# Patient Record
Sex: Male | Born: 1990 | Race: Black or African American | Hispanic: No | Marital: Single | Smoking: Current every day smoker
Health system: Southern US, Community
[De-identification: ages and names within clinical notes are randomized; demographics above are authoritative.]

## PROBLEM LIST (undated history)

## (undated) DIAGNOSIS — J45909 Unspecified asthma, uncomplicated: Secondary | ICD-10-CM

## (undated) DIAGNOSIS — T7840XA Allergy, unspecified, initial encounter: Secondary | ICD-10-CM

## (undated) HISTORY — PX: NECK SURGERY: SHX720

## (undated) HISTORY — DX: Unspecified asthma, uncomplicated: J45.909

## (undated) HISTORY — DX: Allergy, unspecified, initial encounter: T78.40XA

## (undated) HISTORY — PX: SPINE SURGERY: SHX786

---
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
## 2008-04-01 IMAGING — RF DG CERVICAL SPINE 1V
1 series · 1 of 1 positions shown · non-contrast
Comparison: none

CLINICAL DATA: Cervical spine fracture with hyperflexion injury and posterior ligamentous disruption.  Posterior fusion is performed from C4 to C6.
 CERVICAL SPINE - 1 VIEW:

[Series 1: run · 1 of 1 slices shown]
[im 1/1]
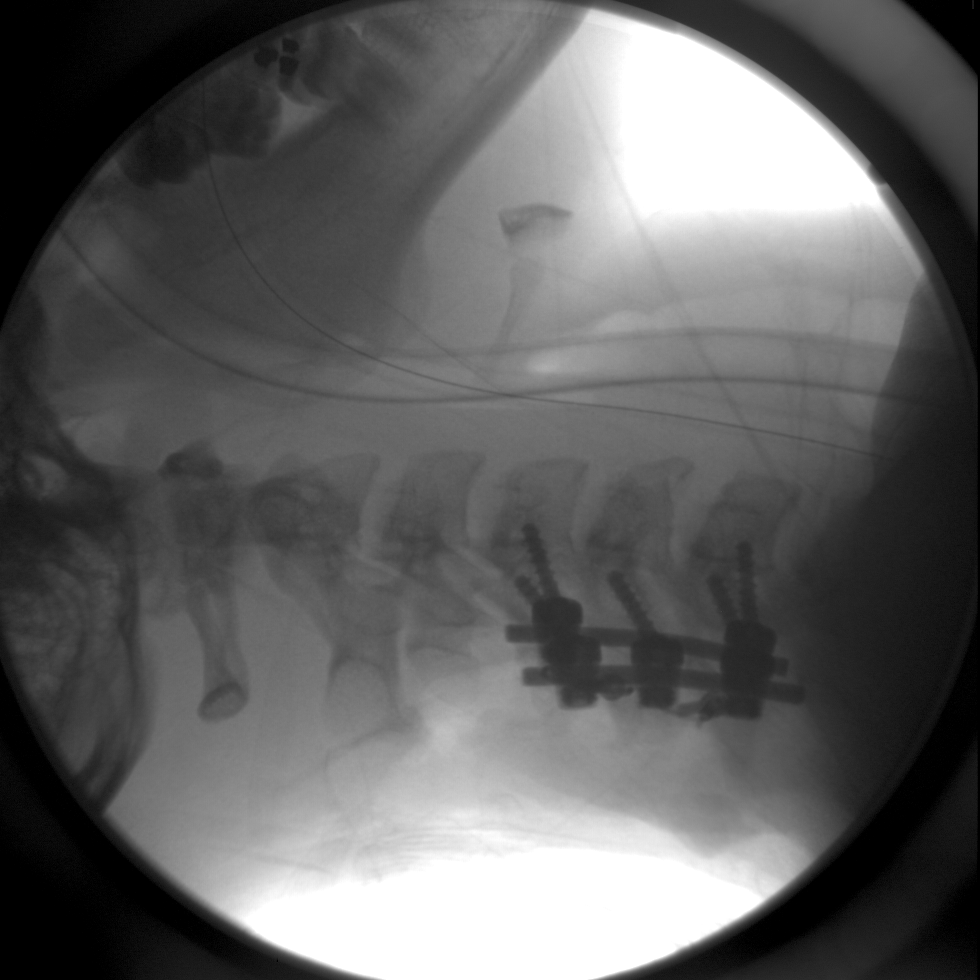

[1 of 1 positions shown; findings below may reference images not displayed]

FINDINGS: Intraoperative spot image done with C-arm demonstrates posterior fusion spanning from C4 to C6 with posterior transpedicular screws and intervening hardware present.  Alignment in the lateral projection appears anatomic.
IMPRESSION: Imaging obtained during posterior fusion spanning from C4 to C6. Alignment appears near anatomic.

## 2012-02-27 ENCOUNTER — Ambulatory Visit: Payer: Self-pay | Admitting: Family Medicine

## 2012-02-27 VITALS — BP 112/67 | HR 88 | Temp 98.5°F | Resp 16 | Ht 69.75 in | Wt 150.6 lb

## 2012-02-27 DIAGNOSIS — L42 Pityriasis rosea: Secondary | ICD-10-CM

## 2012-02-27 NOTE — Progress Notes (Signed)
  Subjective:    Patient ID: Alexander Horn, male    DOB: [birth year], 21 y.o.   MRN: [id number]  HPI 21 yo male here with rash. Noticed on face 2 weeks ago.  Spreading since and on arms and neck aswell.  No pain.  Itch slightly if out in sun but otherwise no.  Never had this before.  Has not tried medicine or creams.  DEnies new soaps, lotions.  No new foods.  Hasn't been out in the woods or nature.  Has seasonal allergies, particularly pollen.    Review of Systems    Negative except as per HPI  Objective:   Physical Exam  Constitutional: He appears well-developed.  Pulmonary/Chest: Effort normal.  Neurological: He is alert.   On trunk, multiple scattered salmon colored, oval , small lesions with some scale in center.  Also present on arms, neck, face.  Also with small, pinpoint lesions.        Assessment & Plan:  Pityriasis rosea - info and reassurance given.

## 2012-02-27 NOTE — Patient Instructions (Signed)
Pityriasis Rosea Pityriasis rosea is a rash which is probably caused by a virus. It generally starts as a scaly, red patch on the trunk (the area of the body that a t-shirt would cover) but does not appear on sun exposed areas. The rash is usually preceded by an initial larger spot called the "herald patch" a week or more before the rest of the rash appears. Generally within one to two days the rash appears rapidly on the trunk, upper arms, and sometimes the upper legs. The rash usually appears as flat, oval patches of scaly pink color. The rash can also be raised and one is able to feel it with a finger. The rash can also be finely crinkled and may slough off leaving a ring of scale around the spot. Sometimes a mild sore throat is present with the rash. It usually affects children and young adults in the spring and autumn. Women are more frequently affected than men. TREATMENT  Pityriasis rosea is a self-limited condition. This means it goes away within 4 to 8 weeks without treatment. The spots may persist for several months, especially in darker-colored skin after the rash has resolved and healed. Benadryl and steroid creams may be used if itching is a problem. SEEK MEDICAL CARE IF:   Your rash does not go away or persists longer than three months.   You develop fever and joint pain.   You develop severe headache and confusion.   You develop breathing difficulty, vomiting and/or extreme weakness.  Document Released: 12/09/2001 Document Revised: 10/22/2011 Document Reviewed: 12/28/2008 [employer] Patient Information 2012 [employer]. 

## 2013-03-04 ENCOUNTER — Ambulatory Visit: Payer: Self-pay | Admitting: Family Medicine

## 2013-03-04 VITALS — BP 125/68 | HR 64 | Temp 98.3°F | Resp 16 | Ht 69.75 in | Wt 153.4 lb

## 2013-03-04 DIAGNOSIS — M545 Low back pain, unspecified: Secondary | ICD-10-CM

## 2013-03-04 DIAGNOSIS — R1012 Left upper quadrant pain: Secondary | ICD-10-CM

## 2013-03-04 DIAGNOSIS — K5900 Constipation, unspecified: Secondary | ICD-10-CM

## 2013-03-04 MED ORDER — OXAPROZIN 600 MG PO TABS: ORAL_TABLET | ORAL | Status: DC

## 2013-03-04 MED ORDER — METHOCARBAMOL 500 MG PO TABS: 500.0000 mg | ORAL_TABLET | Freq: Three times a day (TID) | ORAL | Status: DC

## 2013-03-04 NOTE — Patient Instructions (Addendum)
Take MiraLax one dose daily. If your bowels get to lose decrease to one half dose daily. When you are consistently doing well you can stop it and just use it on as-needed basis.  Take the muscle relaxant and anti-inflammatory medicine as needed. If symptoms continue to persist please return
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
## 2013-10-01 ENCOUNTER — Encounter (HOSPITAL_COMMUNITY): Payer: Self-pay | Admitting: Emergency Medicine

## 2013-10-01 ENCOUNTER — Emergency Department (HOSPITAL_COMMUNITY)
Admission: EM | Admit: 2013-10-01 | Discharge: 2013-10-01 | Disposition: A | Discharge status: Home / Self Care | Payer: No Typology Code available for payment source | Attending: Emergency Medicine | Admitting: Emergency Medicine

## 2013-10-01 DIAGNOSIS — F17200 Nicotine dependence, unspecified, uncomplicated: Secondary | ICD-10-CM | POA: Insufficient documentation

## 2013-10-01 DIAGNOSIS — Z9889 Other specified postprocedural states: Secondary | ICD-10-CM | POA: Insufficient documentation

## 2013-10-01 DIAGNOSIS — S6990XA Unspecified injury of unspecified wrist, hand and finger(s), initial encounter: Secondary | ICD-10-CM | POA: Insufficient documentation

## 2013-10-01 DIAGNOSIS — S139XXA Sprain of joints and ligaments of unspecified parts of neck, initial encounter: Secondary | ICD-10-CM | POA: Insufficient documentation

## 2013-10-01 DIAGNOSIS — S59909A Unspecified injury of unspecified elbow, initial encounter: Secondary | ICD-10-CM | POA: Insufficient documentation

## 2013-10-01 DIAGNOSIS — Y9389 Activity, other specified: Secondary | ICD-10-CM | POA: Insufficient documentation

## 2013-10-01 DIAGNOSIS — J45909 Unspecified asthma, uncomplicated: Secondary | ICD-10-CM | POA: Insufficient documentation

## 2013-10-01 DIAGNOSIS — Y92410 Unspecified street and highway as the place of occurrence of the external cause: Secondary | ICD-10-CM | POA: Insufficient documentation

## 2013-10-01 MED ORDER — DIAZEPAM 5 MG PO TABS: 5.0000 mg | ORAL_TABLET | Freq: Two times a day (BID) | ORAL | Status: DC

## 2013-10-01 MED ORDER — OXYCODONE-ACETAMINOPHEN 5-325 MG PO TABS
2.0000 | ORAL_TABLET | Freq: Once | ORAL | Status: AC
  Administered 2013-10-01: 2 via ORAL
  Filled 2013-10-01: qty 2

## 2013-10-01 MED ORDER — DIAZEPAM 5 MG PO TABS
5.0000 mg | ORAL_TABLET | Freq: Once | ORAL | Status: AC
  Administered 2013-10-01: 5 mg via ORAL
  Filled 2013-10-01: qty 1

## 2013-10-01 MED ORDER — OXYCODONE-ACETAMINOPHEN 5-325 MG PO TABS: 2.0000 | ORAL_TABLET | ORAL | Status: DC | PRN
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
## 2013-10-03 NOTE — ED Provider Notes (Signed)
Medical screening examination/treatment/procedure(s) were performed by non-physician practitioner and as supervising physician I was immediately available for consultation/collaboration.  EKG Interpretation   None         Charles B. Sheldon, MD 10/03/13 0659 

## 2014-11-01 ENCOUNTER — Other Ambulatory Visit: Payer: BC Managed Care – PPO

## 2014-11-01 ENCOUNTER — Ambulatory Visit (INDEPENDENT_AMBULATORY_CARE_PROVIDER_SITE_OTHER): Payer: BC Managed Care – PPO

## 2014-11-01 ENCOUNTER — Ambulatory Visit (INDEPENDENT_AMBULATORY_CARE_PROVIDER_SITE_OTHER): Payer: BC Managed Care – PPO | Admitting: Family Medicine

## 2014-11-01 VITALS — BP 126/74 | HR 68 | Temp 98.1°F | Resp 16 | Ht 68.75 in | Wt 177.4 lb

## 2014-11-01 DIAGNOSIS — R112 Nausea with vomiting, unspecified: Secondary | ICD-10-CM

## 2014-11-01 DIAGNOSIS — R1084 Generalized abdominal pain: Secondary | ICD-10-CM

## 2014-11-01 DIAGNOSIS — R350 Frequency of micturition: Secondary | ICD-10-CM

## 2014-11-01 LAB — POCT CBC
GRANULOCYTE PERCENT: 52.7 % (ref 37–80)
HCT, POC: 49.5 % (ref 43.5–53.7)
HEMOGLOBIN: 16.1 g/dL (ref 14.1–18.1)
LYMPH, POC: 1.9 (ref 0.6–3.4)
MCH, POC: 31.3 pg — AB (ref 27–31.2)
MCHC: 32.6 g/dL (ref 31.8–35.4)
MCV: 96 fL (ref 80–97)
MID (cbc): 0.1 (ref 0–0.9)
MPV: 7.4 fL (ref 0–99.8)
POC GRANULOCYTE: 2.3 (ref 2–6.9)
POC LYMPH %: 44.2 % (ref 10–50)
POC MID %: 3.1 % (ref 0–12)
Platelet Count, POC: 307 10*3/uL (ref 142–424)
RBC: 5.15 M/uL (ref 4.69–6.13)
RDW, POC: 12.7 %
WBC: 7.3 10*3/uL (ref 4.6–10.2)

## 2014-11-01 LAB — POCT URINALYSIS DIPSTICK
BILIRUBIN UA: NEGATIVE
GLUCOSE UA: NEGATIVE
KETONES UA: NEGATIVE
Leukocytes, UA: NEGATIVE
NITRITE UA: NEGATIVE
PH UA: 7
Protein, UA: NEGATIVE
RBC UA: NEGATIVE
SPEC GRAV UA: 1.015
Urobilinogen, UA: 0.2

## 2014-11-01 LAB — POCT UA - MICROSCOPIC ONLY
BACTERIA, U MICROSCOPIC: NEGATIVE
CRYSTALS, UR, HPF, POC: NEGATIVE
Casts, Ur, LPF, POC: NEGATIVE
Epithelial cells, urine per micros: NEGATIVE
MUCUS UA: NEGATIVE
RBC, URINE, MICROSCOPIC: NEGATIVE
WBC, Ur, HPF, POC: NEGATIVE
Yeast, UA: NEGATIVE

## 2014-11-01 LAB — GLUCOSE, POCT (MANUAL RESULT ENTRY): POC GLUCOSE: 71 mg/dL (ref 70–99)

## 2014-11-01 NOTE — Patient Instructions (Addendum)
Cut back on caffeine amounts by half for now. Still stay hydrated with water during the day.   Try over the counter Colace as stool softener.  If no bowel movement in a day - can take over the counter Miralax. You should receive a call or letter about your lab results within the next week to 10 days.  If your abdominal pain does not improve in next 4-5 days - recheck to discuss other causes. Return to the clinic or go to the nearest emergency room if any of your symptoms worsen or new symptoms occur.  Abdominal Pain Many things can cause abdominal pain. Usually, abdominal pain is not caused by a disease and will improve without treatment. It can often be observed and treated at home. Your health care provider will do a physical exam and possibly order blood tests and X-rays to help determine the seriousness of your pain. However, in many cases, more time must pass before a clear cause of the pain can be found. Before that point, your health care provider may not know if you need more testing or further treatment. HOME CARE INSTRUCTIONS  Monitor your abdominal pain for any changes. The following actions may help to alleviate any discomfort you are experiencing:  Only take over-the-counter or prescription medicines as directed by your health care provider.  Do not take laxatives unless directed to do so by your health care provider.  Try a clear liquid diet (broth, tea, or water) as directed by your health care provider. Slowly move to a bland diet as tolerated. SEEK MEDICAL CARE IF:  You have unexplained abdominal pain.  You have abdominal pain associated with nausea or diarrhea.  You have pain when you urinate or have a bowel movement.  You experience abdominal pain that wakes you in the night.  You have abdominal pain that is worsened or improved by eating food.  You have abdominal pain that is worsened with eating fatty foods.  You have a fever. SEEK IMMEDIATE MEDICAL CARE IF:    Your pain does not go away within 2 hours.  You keep throwing up (vomiting).  Your pain is felt only in portions of the abdomen, such as the right side or the left lower portion of the abdomen.  You pass bloody or black tarry stools. MAKE SURE YOU:  Understand these instructions.   Will watch your condition.   Will get help right away if you are not doing well or get worse.  Document Released: 08/12/2005 Document Revised: 11/07/2013 Document Reviewed: 07/12/2013 [facility] LPExitCare Patient Information 2015 [employer], [place]. This information is not intended to replace advice given to you by your health care provider. Make sure you discuss any questions you have with your health care provider.

## 2014-11-01 NOTE — Progress Notes (Addendum)
Subjective:  This chart was scribed for Meredith StaggersJeffrey Concha Sudol, MD by Haywood PaoNadim Abu Hashem, ED Scribe at [facility].The patient was seen in exam room 03 and the patient's care was started at 2:29 PM.   Patient ID: Alexander Horn, male    DOB: 1990-12-10, 23 y.o.   MRN: [id number] Chief Complaint  Patient presents with  . Abdominal Pain    x 2 weeks   HPI HPI Comments: Alexander GrammesDominique Horn is a 23 y.o. male who [facility] of intermittent generalized abdominal pain, onset 2 weeks ago. Lasting about 30 min. Not everyday, comes and goes.  4 days he had nausea and dry heaving, yesterday he had similar complaints. His last normal BM was this morning, these past two weeks he has gone about everyday and they are sometimes runny or hard stools. He has not tried anything for relief. He denies fever, denies dysuria.   Pt also complains about increased urinary frequency, onset 2 months ago. Pt denies weight loss. Pt does have a FHx of diabetes in grandparents.   Hx later in visit:  Drinks 2 [employer] drinks each morning, then soda later in the day. No penile discharge. No recent testicular pain, no hx of STI's, no dysuria. No recent new sexual partners, never been tested for STI's. No new recent new partners.  Greater than 20 lifetime sexual partners.   Works for a [employer].  Smoker - 2-3 cigs per day.  Case beer per week. (up to 2 per day).     There are no active problems to display for this patient.  Past Medical History  Diagnosis Date  . Asthma   . Allergy    Past Surgical History  Procedure Laterality Date  . Spine surgery    . Neck surgery     Allergies  Allergen Reactions  . Pollen Extract    Prior to Admission medications   Medication Sig Start Date End Date Taking? Authorizing Provider  cetirizine (ALL DAY ALLERGY) 10 MG tablet Take 10 mg by mouth daily.   Yes Historical Provider, MD   History   Social History  . Marital Status: Single   Spouse Name: N/A    Number of Children: N/A  . Years of Education: N/A   Occupational History  . Not on file.   Social History Main Topics  . Smoking status: Current Every Day Smoker  . Smokeless tobacco: Not on file  . Alcohol Use: Yes  . Drug Use: No     Comment: former marijuana user  . Sexual Activity: Not on file   Other Topics Concern  . Not on file   Social History Narrative   Review of Systems  Gastrointestinal: Positive for abdominal pain.  Genitourinary: Positive for frequency. Negative for dysuria and difficulty urinating.       Objective:  BP 126/74 mmHg  Pulse 68  Temp(Src) 98.1 F (36.7 C) (Oral)  Resp 16  Ht 5' 8.75" (1.746 m)  Wt 177 lb 6.4 oz (80.468 kg)  BMI 26.40 kg/m2  SpO2 98%  Physical Exam  Constitutional: He is oriented to person, place, and time. He appears well-developed and well-nourished.  HENT:  Head: Normocephalic and atraumatic.  Eyes: EOM are normal. Pupils are equal, round, and reactive to light.  Neck: Normal range of motion. No JVD present. Carotid bruit is not present.  Cardiovascular: Normal rate, regular rhythm and normal heart sounds.   No murmur heard. Pulmonary/Chest: Effort normal and breath  sounds normal. He has no rales.  Abdominal: Soft. Bowel sounds are normal. He exhibits no distension. There is no CVA tenderness.  Musculoskeletal: Normal range of motion. He exhibits no edema.  Neurological: He is alert and oriented to person, place, and time.  Skin: Skin is warm and dry.  Psychiatric: He has a normal mood and affect. His behavior is normal.  Nursing note and vitals reviewed.  Results for orders placed or performed in visit on 11/01/14  POCT urinalysis dipstick  Result Value Ref Range   Color, UA yellow    Clarity, UA clear    Glucose, UA neg    Bilirubin, UA neg    Ketones, UA neg    Spec Grav, UA 1.015    Blood, UA neg    pH, UA 7.0    Protein, UA neg    Urobilinogen, UA 0.2    Nitrite, UA neg     Leukocytes, UA Negative   POCT UA - Microscopic Only  Result Value Ref Range   WBC, Ur, HPF, POC Neg    RBC, urine, microscopic neg    Bacteria, U Microscopic neg    Mucus, UA neg'    Epithelial cells, urine per micros neg    Crystals, Ur, HPF, POC neg    Casts, Ur, LPF, POC neg    Yeast, UA neg   POCT CBC  Result Value Ref Range   WBC 7.3 4.6 - 10.2 K/uL   Lymph, poc 1.9 0.6 - 3.4   POC LYMPH PERCENT 44.2 10 - 50 %L   MID (cbc) 0.1 0 - 0.9   POC MID % 3.1 0 - 12 %M   POC Granulocyte 2.3 2 - 6.9   Granulocyte percent 52.7 37 - 80 %G   RBC 5.15 4.69 - 6.13 M/uL   Hemoglobin 16.1 14.1 - 18.1 g/dL   HCT, POC 65.7 84.6 - 53.7 %   MCV 96.0 80 - 97 fL   MCH, POC 31.3 (A) 27 - 31.2 pg   MCHC 32.6 31.8 - 35.4 g/dL   RDW, POC 96.2 %   Platelet Count, POC 307 142 - 424 K/uL   MPV 7.4 0 - 99.8 fL  POCT glucose (manual entry)  Result Value Ref Range   POC Glucose 71 70 - 99 mg/dl   [facility] reading (PRIMARY) by  Dr. Neva Seat: abd 1 view: increased stool, but otherwise nonspecific bowel gas findings.      Assessment & Plan:  Alexander Horn is a 23 y.o. male Generalized abdominal pain - Plan: POCT glucose (manual entry), DG Abd 1 View, Lipase, Comprehensive metabolic panel, RPR, HIV antibody, GC/Chlamydia Probe Amp, GC/Chlamydia Probe Amp, CANCELED: GC/Chlamydia Probe Amp  -suspected constipation as reassuring exam, blood count. Trial of otc stool softener, miralax if needed. Also discussed caffiene intake modification as below. Check lipase, CMP. rtc precautions and recheck if not improving in next few days.   Nausea and vomiting, vomiting of unspecified type - Plan: POCT CBC, POCT glucose (manual entry), Lipase, Comprehensive metabolic panel, RPR, HIV antibody, GC/Chlamydia Probe Amp, GC/Chlamydia Probe Amp, CANCELED: GC/Chlamydia Probe Amp  - now resolved. Reassuring exam. rtc precautions if persists.   Urinary frequency - Plan: POCT urinalysis dipstick, POCT UA - Microscopic Only,  Lipase, Comprehensive metabolic panel, RPR, HIV antibody, GC/Chlamydia Probe Amp, GC/Chlamydia Probe Amp, CANCELED: GC/Chlamydia Probe Amp  -decrease caffeine, water during day, and if persists- rtc for eval.   No orders of the defined types were placed in this  encounter.   Patient Instructions  Cut back on caffeine amounts by half for now. Still stay hydrated with water during the day.   Try over the counter Colace as stool softener.  If no bowel movement in a day - can take over the counter Miralax. You should receive a call or letter about your lab results within the next week to 10 days.  If your abdominal pain does not improve in next 4-5 days - recheck to discuss other causes. Return to the clinic or go to the nearest emergency room if any of your symptoms worsen or new symptoms occur.  Abdominal Pain Many things can cause abdominal pain. Usually, abdominal pain is not caused by a disease and will improve without treatment. It can often be observed and treated at home. Your health care provider will do a physical exam and possibly order blood tests and X-rays to help determine the seriousness of your pain. However, in many cases, more time must pass before a clear cause of the pain can be found. Before that point, your health care provider may not know if you need more testing or further treatment. HOME CARE INSTRUCTIONS  Monitor your abdominal pain for any changes. The following actions may help to alleviate any discomfort you are experiencing:  Only take over-the-counter or prescription medicines as directed by your health care provider.  Do not take laxatives unless directed to do so by your health care provider.  Try a clear liquid diet (broth, tea, or water) as directed by your health care provider. Slowly move to a bland diet as tolerated. SEEK MEDICAL CARE IF:  You have unexplained abdominal pain.  You have abdominal pain associated with nausea or diarrhea.  You have pain when  you urinate or have a bowel movement.  You experience abdominal pain that wakes you in the night.  You have abdominal pain that is worsened or improved by eating food.  You have abdominal pain that is worsened with eating fatty foods.  You have a fever. SEEK IMMEDIATE MEDICAL CARE IF:   Your pain does not go away within 2 hours.  You keep throwing up (vomiting).  Your pain is felt only in portions of the abdomen, such as the right side or the left lower portion of the abdomen.  You pass bloody or black tarry stools. MAKE SURE YOU:  Understand these instructions.   Will watch your condition.   Will get help right away if you are not doing well or get worse.  Document Released: 08/12/2005 Document Revised: 11/07/2013 Document Reviewed: 07/12/2013 [facility] Patient Information 2015 [employer] [place]. This information is not intended to replace advice given to you by your health care provider. Make sure you discuss any questions you have with your health care provider.     I personally performed the services described in this documentation, which was scribed in my presence. The recorded information has been reviewed and considered, and addended by me as needed.

## 2014-11-02 LAB — HIV ANTIBODY (ROUTINE TESTING W REFLEX): HIV 1&2 Ab, 4th Generation: NONREACTIVE

## 2014-11-02 LAB — COMPREHENSIVE METABOLIC PANEL
ALK PHOS: 47 U/L (ref 39–117)
ALT: 25 U/L (ref 0–53)
AST: 22 U/L (ref 0–37)
Albumin: 4.9 g/dL (ref 3.5–5.2)
BUN: 10 mg/dL (ref 6–23)
CO2: 23 mEq/L (ref 19–32)
CREATININE: 0.89 mg/dL (ref 0.50–1.35)
Calcium: 9.6 mg/dL (ref 8.4–10.5)
Chloride: 102 mEq/L (ref 96–112)
Glucose, Bld: 74 mg/dL (ref 70–99)
Potassium: 4.2 mEq/L (ref 3.5–5.3)
Sodium: 136 mEq/L (ref 135–145)
Total Bilirubin: 0.5 mg/dL (ref 0.2–1.2)
Total Protein: 7.9 g/dL (ref 6.0–8.3)

## 2014-11-02 LAB — RPR

## 2014-11-02 LAB — GC/CHLAMYDIA PROBE AMP
CT PROBE, AMP APTIMA: NEGATIVE
GC PROBE AMP APTIMA: NEGATIVE

## 2014-11-02 LAB — LIPASE: LIPASE: 13 U/L (ref 0–75)

## 2016-05-12 IMAGING — CR DG ABDOMEN 1V
1 series · 1 of 1 positions shown · non-contrast
Comparison: CT abdomen and pelvis September 18, 2006

CLINICAL DATA: Two week history of intermittent abdominal pain,
generalized. Four day history of nausea

EXAM:
ABDOMEN - 1 VIEW

[AP]
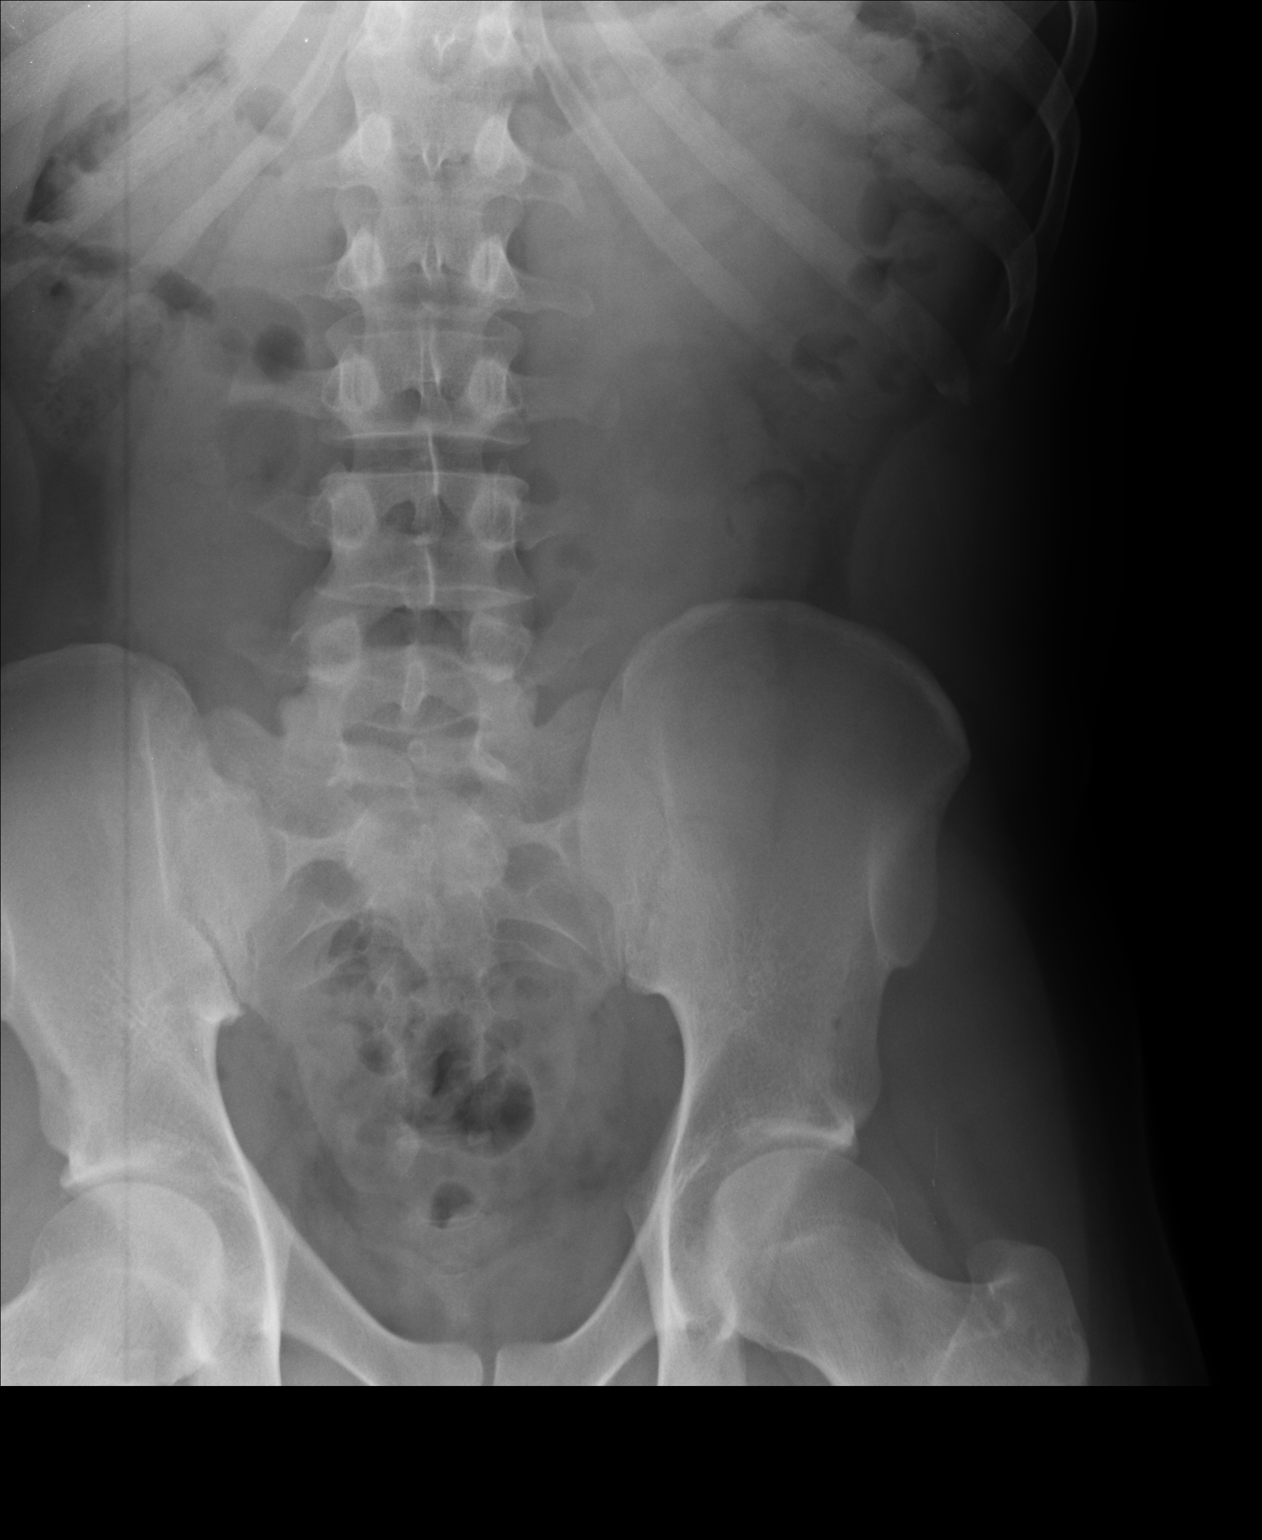

[1 of 1 positions shown; findings below may reference images not displayed]

FINDINGS: There is moderate stool in the colon. Bowel gas pattern is
unremarkable. No obstruction or free air is seen on this supine
examination. No abnormal calcifications. Incidental note is made of
spina bifida occulta at S1.
IMPRESSION: Overall bowel gas pattern unremarkable.

## 2022-10-18 ENCOUNTER — Other Ambulatory Visit: Payer: Self-pay

## 2022-10-18 ENCOUNTER — Encounter (HOSPITAL_BASED_OUTPATIENT_CLINIC_OR_DEPARTMENT_OTHER): Payer: Self-pay | Admitting: Emergency Medicine

## 2022-10-18 DIAGNOSIS — R42 Dizziness and giddiness: Secondary | ICD-10-CM | POA: Insufficient documentation

## 2022-10-18 DIAGNOSIS — R0981 Nasal congestion: Secondary | ICD-10-CM | POA: Insufficient documentation

## 2022-10-18 NOTE — ED Triage Notes (Signed)
Pt here from home with c/o dizziness while working , constant , no headache or blurred vision

## 2022-10-19 ENCOUNTER — Emergency Department (HOSPITAL_BASED_OUTPATIENT_CLINIC_OR_DEPARTMENT_OTHER)
Admission: EM | Admit: 2022-10-19 | Discharge: 2022-10-19 | Disposition: A | Discharge status: Home / Self Care | Payer: Self-pay | Attending: Emergency Medicine | Admitting: Emergency Medicine

## 2022-10-19 DIAGNOSIS — R42 Dizziness and giddiness: Secondary | ICD-10-CM

## 2022-10-19 MED ORDER — MECLIZINE HCL 25 MG PO TABS
25.0000 mg | ORAL_TABLET | Freq: Once | ORAL | Status: AC
  Administered 2022-10-19: 25 mg via ORAL
  Filled 2022-10-19: qty 1

## 2022-10-19 MED ORDER — MECLIZINE HCL 25 MG PO TABS: 25.0000 mg | ORAL_TABLET | Freq: Three times a day (TID) | ORAL | 0 refills | Status: AC | PRN

## 2022-10-19 NOTE — ED Provider Notes (Signed)
[facility] EMERGENCY DEPT  Provider Note  CSN: [id number] Arrival date & time: 10/18/22 2208  History Chief Complaint  Patient presents with   Dizziness    Alexander Horn is a 31 y.o. male with PMH works as an Pensions consultant, reports during his shift on day of arrival he began feeling dizzy, describes a spinning off balance dizziness. He finished working and went home to lie down and seemed to feel better. He got up and was still a little dizzy so he came to the ED for evaluation. He reports symptoms were worse with turning his head to the left but now that has resolved. He is more or less back to baseline now. Has had some nasal congestion recently but otherwise has been in his usual state of health.    Home Medications Prior to Admission medications   Medication Sig Start Date End Date Taking? Authorizing Provider  meclizine (ANTIVERT) 25 MG tablet Take 1 tablet (25 mg total) by mouth 3 (three) times daily as needed for dizziness. 10/19/22  Yes Pollyann Savoy, MD  cetirizine (ALL DAY ALLERGY) 10 MG tablet Take 10 mg by mouth daily.    [provider]     Allergies    Pollen extract   Review of Systems   Review of Systems Please see HPI for pertinent positives and negatives  Physical Exam BP 121/85 (BP Location: Right Arm)   Pulse 67   Temp 97.9 F (36.6 C) (Oral)   Resp 20   SpO2 97%   Physical Exam Vitals and nursing note reviewed.  Constitutional:      Appearance: Normal appearance.  HENT:     Head: Normocephalic and atraumatic.     Right Ear: Tympanic membrane normal.     Left Ear: Tympanic membrane normal.     Nose: Nose normal.     Mouth/Throat:     Mouth: Mucous membranes are moist.  Eyes:     Extraocular Movements: Extraocular movements intact.     Conjunctiva/sclera: Conjunctivae normal.     Pupils: Pupils are equal, round, and reactive to light.     Comments: No nystagmus  Cardiovascular:     Rate and Rhythm:  Normal rate.  Pulmonary:     Effort: Pulmonary effort is normal.     Breath sounds: Normal breath sounds.  Abdominal:     General: Abdomen is flat.     Palpations: Abdomen is soft.     Tenderness: There is no abdominal tenderness.  Musculoskeletal:        General: No swelling. Normal range of motion.     Cervical back: Neck supple.  Skin:    General: Skin is warm and dry.  Neurological:     General: No focal deficit present.     Mental Status: He is alert and oriented to person, place, and time.     Cranial Nerves: No cranial nerve deficit.     Sensory: No sensory deficit.     Motor: No weakness.     Gait: Gait normal.  Psychiatric:        Mood and Affect: Mood normal.     ED Results / Procedures / Treatments   EKG None  Procedures Procedures  Medications Ordered in the ED Medications  meclizine (ANTIVERT) tablet 25 mg (has no administration in time range)    Initial Impression and Plan  Patient here with what sounds like a mild case of vertigo. Symptoms mostly resolved now. 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 Will give a dose of meclizine now. Rx for same. If he is not feeling better in the AM advised to stay out of work until he is able to drive safely. PCP follow up or RTED if he is not getting better.   ED Course       MDM Rules/Calculators/A&P Medical Decision Making Problems Addressed: Vertigo: acute illness or injury  Risk Prescription drug management.    Final Clinical Impression(s) / ED Diagnoses Final diagnoses:  Vertigo    Rx / DC Orders ED Discharge Orders          Ordered    meclizine (ANTIVERT) 25 MG tablet  3 times daily PRN        10/19/22 0146             Pollyann Savoy, MD 10/19/22 [phone]

## 2023-02-06 ENCOUNTER — Emergency Department (HOSPITAL_BASED_OUTPATIENT_CLINIC_OR_DEPARTMENT_OTHER)
Admission: EM | Admit: 2023-02-06 | Discharge: 2023-02-07 | Disposition: A | Discharge status: Home / Self Care | Payer: Self-pay | Attending: Emergency Medicine | Admitting: Emergency Medicine

## 2023-02-06 ENCOUNTER — Other Ambulatory Visit: Payer: Self-pay

## 2023-02-06 DIAGNOSIS — R21 Rash and other nonspecific skin eruption: Secondary | ICD-10-CM

## 2023-02-06 DIAGNOSIS — B354 Tinea corporis: Secondary | ICD-10-CM | POA: Insufficient documentation

## 2023-02-06 DIAGNOSIS — J45909 Unspecified asthma, uncomplicated: Secondary | ICD-10-CM | POA: Insufficient documentation

## 2023-02-06 MED ORDER — KETOCONAZOLE 2 % EX CREA: 1.0000 | TOPICAL_CREAM | Freq: Every day | CUTANEOUS | 0 refills | Status: AC

## 2023-02-06 MED ORDER — FLUCONAZOLE 150 MG PO TABS: 150.0000 mg | ORAL_TABLET | ORAL | 0 refills | Status: AC

## 2023-02-06 MED ORDER — DOXYCYCLINE HYCLATE 100 MG PO CAPS: 100.0000 mg | ORAL_CAPSULE | Freq: Two times a day (BID) | ORAL | 0 refills | Status: DC

## 2023-02-06 NOTE — Discharge Instructions (Addendum)
Apply the antifungal cream daily for the next month as prescribed and take the antifungal pill weekly for the next 2 weeks as prescribed.  Make an appointment with dermatology if your rash is not improving.  You can come back if you develop any further symptoms such as difficulty breathing, fevers, worsening rash, peeling of your skin, severe pain, or any other symptoms concerning to you.

## 2023-02-06 NOTE — ED Triage Notes (Signed)
POV from home, A&O x 4, GCS 15, amb to triage  C/o rash x 1 month, applying antifungal cream at home without relief. Pt has circular red raised skin on right shoulder.

## 2023-02-06 NOTE — ED Provider Notes (Signed)
[facility] Provider Note   CSN: [id number] Arrival date & time: 02/06/23  2208     History  Chief Complaint  Patient presents with   Rash    Alexander Horn is a 32 y.o. male.  With PMH of allergies and asthma who presents with right shoulder rash that has been present for over a month.  He thinks he has ringworm.  Is been itchy but not painful.  He has had no systemic symptoms, no fevers, no nausea, no vomiting, no wheezing or difficulty breathing.  He takes no oral medicines.  He bought over-the-counter antifungal cream but has not had relief.  He finally came in today because it is not gone away.  He has not seen dermatologist.  Of note he says he did have a tick bite back in February but denies any recent tick bites.  He has had no other associated symptoms.   Rash      Home Medications Prior to Admission medications   Medication Sig Start Date End Date Taking? Authorizing Provider  fluconazole (DIFLUCAN) 150 MG tablet Take 1 tablet (150 mg total) by mouth once a week for 2 doses. 02/06/23 02/14/23 Yes Elgie Congo, MD  ketoconazole (NIZORAL) 2 % cream Apply 1 Application topically daily. 02/06/23 03/08/23 Yes Elgie Congo, MD  cetirizine (ALL DAY ALLERGY) 10 MG tablet Take 10 mg by mouth daily.    [provider]  meclizine (ANTIVERT) 25 MG tablet Take 1 tablet (25 mg total) by mouth 3 (three) times daily as needed for dizziness. 10/19/22   Truddie Hidden, MD      Allergies    Pollen extract    Review of Systems   Review of Systems  Skin:  Positive for rash.    Physical Exam Updated Vital Signs BP 130/89 (BP Location: Right Arm)   Pulse 83   Temp 98.2 F (36.8 C)   Resp 16   Ht 5\' 10"  (1.778 m)   Wt 75.8 kg   SpO2 99%   BMI 23.96 kg/m  Physical Exam Constitutional: Alert and oriented. Well appearing and in no distress. Eyes: Conjunctivae are normal. ENT      Head: Normocephalic and  atraumatic. Cardiovascular: S1, S2, RRR Respiratory: Normal respiratory effort. Breath sounds are normal. O2 sat 99 on RA Gastrointestinal: nondistended Musculoskeletal: Normal range of motion in all extremities. Neurologic: Normal speech and language.  Skin: Skin is warm, dry. See media. Nontender hyperpigmented circular plaque rash with central clearing and scaling rased edges. Psychiatric: Mood and affect are normal. Speech and behavior are normal.  ED Results / Procedures / Treatments   Labs (all labs ordered are listed, but only abnormal results are displayed) Labs Reviewed - No data to display  EKG None  Radiology No results found.  Procedures Procedures    Medications Ordered in ED Medications - No data to display  ED Course/ Medical Decision Making/ A&P                            Medical Decision Making Alexander Horn is a 32 y.o. male.  With PMH of allergies and asthma who presents with right shoulder rash that has been present for over a month.    See media.  Patient has raised circular scaling hyperpigmented rash that is pruritic in nature.  He has no systemic illness symptoms.  Seems most consistent with tinea corporis.  Has failed  over-the-counter Lotrimin cream.  Will start patient on ketoconazole cream and will give prescription for weekly fluconazole for the next 2 weeks due to failure of topical treatment.  Provided dermatology information for outpatient follow-up.  Discussed return precautions.   Considering alternative and potentially life-threatening etiologies of rash:  No  pustules, involvement of skin folds or fever to suggest acute generalized exanthematous pustulosis (AGEP, commonly occuring within hours to days of new drug exposure).  No wheezing, throat or oropharyngeal tightness/swelling, lightheadedness, hypotension/tachycardia, or recent exposure to suggest anaphylaxis  No oropharyngeal swelling, edema, drooling, or voice change to suggest  angioedema  No systemic symptoms including fever, lymphadenopathy, arthritis, hematuria, abdominal pain or symptoms of visceral inflammation to suggest Drug Reaction wit Eosinophilia and Systemic Symptoms or DRESS (commonly seen 2-6 weeks after new drug exposure)  No diffuse erythematous rash involving more than 90% TBSA, history of psoriasis or atopic dermatitis, new drug exposure or exam findings (hyperthermia, tachycardia, peripheral edema or generalized lymphadenopathy) to suggest erythroderma  No fever, other systemic symptoms, erythema, warmth, focal tenderness, crepitance or pain out of proportion to exam to suggest significant cellulitis including necrotizing fasciitis  No blisters or bullae to suggest pemphigoid or pemphigus diseases.  No involvement of mucosal surfaces, no painful rash, no skin breakdown with pressure to suggest Steven's Johnson's Syndrome or toxic epidermal necrolysis  No headache, myalgias, systemic sx or history of recent tick bites to suggest RMSF  No fever, petechiae, hemodynamic instability, toxic appearance, evidence of end organ involvement or limb ischemia to suggest serious systemic infectious cause including bacteremia, septic emboli or meningococcemia  No petechiae, palpable purpura or arthritis, to suggest vasculitits.   Risk Prescription drug management.      Final Clinical Impression(s) / ED Diagnoses Final diagnoses:  Tinea corporis  Rash    Rx / DC Orders ED Discharge Orders          Ordered    ketoconazole (NIZORAL) 2 % cream  Daily        02/06/23 2345    doxycycline (VIBRAMYCIN) 100 MG capsule  2 times daily,   Status:  Discontinued        02/06/23 2345    fluconazole (DIFLUCAN) 150 MG tablet  Weekly        02/06/23 2350              Elgie Congo, MD 02/06/23 [phone]
